# Patient Record
Sex: Male | Born: 1995 | Race: Black or African American | Hispanic: No | Marital: Single | Smoking: Never smoker
Health system: Southern US, Community
[De-identification: ages and names within clinical notes are randomized; demographics above are authoritative.]

## PROBLEM LIST (undated history)

## (undated) HISTORY — PX: NO PAST SURGERIES: SHX2092

---

## 2020-02-20 ENCOUNTER — Emergency Department (HOSPITAL_COMMUNITY): Payer: BLUE CROSS/BLUE SHIELD

## 2020-02-20 ENCOUNTER — Encounter (HOSPITAL_COMMUNITY): Payer: Self-pay

## 2020-02-20 ENCOUNTER — Other Ambulatory Visit: Payer: Self-pay

## 2020-02-20 ENCOUNTER — Emergency Department (HOSPITAL_COMMUNITY)
Admission: EM | Admit: 2020-02-20 | Discharge: 2020-02-20 | Disposition: A | Discharge status: Home / Self Care | Payer: BLUE CROSS/BLUE SHIELD | Attending: Emergency Medicine | Admitting: Emergency Medicine

## 2020-02-20 DIAGNOSIS — R0789 Other chest pain: Secondary | ICD-10-CM | POA: Insufficient documentation

## 2020-02-20 LAB — CBC WITH DIFFERENTIAL/PLATELET
Abs Immature Granulocytes: 0.02 10*3/uL (ref 0.00–0.07)
Basophils Absolute: 0 10*3/uL (ref 0.0–0.1)
Basophils Relative: 1 %
Eosinophils Absolute: 0.1 10*3/uL (ref 0.0–0.5)
Eosinophils Relative: 2 %
HCT: 48.9 % (ref 39.0–52.0)
Hemoglobin: 16.6 g/dL (ref 13.0–17.0)
Immature Granulocytes: 0 %
Lymphocytes Relative: 24 %
Lymphs Abs: 1.3 10*3/uL (ref 0.7–4.0)
MCH: 31.9 pg (ref 26.0–34.0)
MCHC: 33.9 g/dL (ref 30.0–36.0)
MCV: 93.9 fL (ref 80.0–100.0)
Monocytes Absolute: 0.5 10*3/uL (ref 0.1–1.0)
Monocytes Relative: 9 %
Neutro Abs: 3.5 10*3/uL (ref 1.7–7.7)
Neutrophils Relative %: 64 %
Platelets: 136 10*3/uL — ABNORMAL LOW (ref 150–400)
RBC: 5.21 MIL/uL (ref 4.22–5.81)
RDW: 12.9 % (ref 11.5–15.5)
WBC: 5.4 10*3/uL (ref 4.0–10.5)
nRBC: 0 % (ref 0.0–0.2)

## 2020-02-20 LAB — COMPREHENSIVE METABOLIC PANEL
ALT: 14 U/L (ref 0–44)
AST: 27 U/L (ref 15–41)
Albumin: 4 g/dL (ref 3.5–5.0)
Alkaline Phosphatase: 56 U/L (ref 38–126)
Anion gap: 8 (ref 5–15)
BUN: 9 mg/dL (ref 6–20)
CO2: 27 mmol/L (ref 22–32)
Calcium: 9.4 mg/dL (ref 8.9–10.3)
Chloride: 104 mmol/L (ref 98–111)
Creatinine, Ser: 1.26 mg/dL — ABNORMAL HIGH (ref 0.61–1.24)
GFR, Estimated: >60 mL/min (ref >60)
Glucose, Bld: 101 mg/dL — ABNORMAL HIGH (ref 70–99)
Potassium: 4.2 mmol/L (ref 3.5–5.1)
Sodium: 139 mmol/L (ref 135–145)
Total Bilirubin: 1.3 mg/dL — ABNORMAL HIGH (ref 0.3–1.2)
Total Protein: 6.7 g/dL (ref 6.5–8.1)

## 2020-02-20 LAB — URINALYSIS, ROUTINE W REFLEX MICROSCOPIC
Bacteria, UA: NONE SEEN
Bilirubin Urine: NEGATIVE
Glucose, UA: NEGATIVE mg/dL
Hgb urine dipstick: NEGATIVE
Ketones, ur: NEGATIVE mg/dL
Nitrite: NEGATIVE
Protein, ur: NEGATIVE mg/dL
Specific Gravity, Urine: 1.023 (ref 1.005–1.030)
pH: 7 (ref 5.0–8.0)

## 2020-02-20 LAB — PROTIME-INR
INR: 1 (ref 0.8–1.2)
Prothrombin Time: 13.2 seconds (ref 11.4–15.2)

## 2020-02-20 LAB — D-DIMER, QUANTITATIVE: D-Dimer, Quant: 0.34 ug/mL-FEU (ref 0.00–0.50)

## 2020-02-20 LAB — LIPASE, BLOOD: Lipase: 26 U/L (ref 11–51)

## 2020-02-20 LAB — TROPONIN I (HIGH SENSITIVITY): Troponin I (High Sensitivity): 4 ng/L (ref <18)

## 2020-02-20 NOTE — ED Triage Notes (Signed)
Pt reports Left side CP, SOB and headache after football practice yesterday. Seen at [facility] but sent home. Coach sent pt here to have further evaluation

## 2020-02-20 NOTE — Discharge Instructions (Addendum)
1.  At this time, there are no specific abnormalities on your x-rays, lab tests or EKGs. 2.  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 3.  You are released to return to practice at a modified level.  No heavy exertion, such as full speed running, repetitive exercises significantly increasing heart rate and respiratory rate.  If you experience any chest pain while exercising, stop immediately and go to a medical facility. 4.  You are to follow-up with cardiology for a recheck.  Schedule this as soon as possible.

## 2020-02-20 NOTE — ED Notes (Signed)
Pt stated that he did not want to checked for Covid because he was checked yesterday at school

## 2020-02-20 NOTE — ED Provider Notes (Signed)
[facility] EMERGENCY DEPARTMENT Provider Note   CSN: [id number] Arrival date & time: 02/20/20  0930     History Chief Complaint  Patient presents with  . Chest Pain    Adrian Brown is a 24 y.o. male.  HPI Patient experienced chest pain yesterday.  He is otherwise healthy without medical problems.  Patient reports he awakened yesterday morning, early.  He had a slight discomfort in his left chest but he went back to sleep for couple of hours before going to football practice.  He reports when he went to practice, he had some mild discomfort in the chest but did not report it at the time.  He reports a discomfort on the left upper chest.  They went on then to have a more intense practice and he was running aggressively.  He reports he then started to get a more intense chest pain in the left upper chest.  He denies any syncope.  He denies he specifically felt short of breath.  He does however reports that he slowed down and took a rest because the chest pain.  He denies he felt lightheaded or like he would pass out.  He denies he felt increasingly short of breath.  Patient reports he did let his coach know about it and they wanted him to get evaluated.  He went to urgent care yesterday but did not have extensive evaluation.  He was urged by his coach to come to the emergency department today for returning to practice.   Family history: No early onset coronary artery disease.  No known individuals with sudden unexplained death at young ages.  Patient had Covid immunization last dose in July.  He is tested every other day for football practice.  Patient has no personal history of having had Covid. History reviewed. No pertinent past medical history.  There are no problems to display for this patient.   History reviewed. No pertinent surgical history.     History reviewed. No pertinent family history.  Social History   Tobacco Use  . Smoking status: Never Smoker   . Smokeless tobacco: Never Used  Substance Use Topics  . Alcohol use: Never  . Drug use: Never    Home Medications Prior to Admission medications   Medication Sig Start Date End Date Taking? Authorizing Provider  ibuprofen (ADVIL) 200 MG tablet Take 400 mg by mouth every 6 (six) hours as needed for moderate pain.   Yes [provider]    Allergies    Patient has no known allergies.  Review of Systems   Review of Systems 10 systems reviewed and negative except as per HPI Physical Exam Updated Vital Signs BP (!) 126/58 (BP Location: Right Arm)   Pulse 91   Temp 97.6 F (36.4 C) (Oral)   Resp 17   Ht 6\' 5"  (1.956 m)   Wt 84.8 kg   SpO2 100%   BMI 22.17 kg/m   Physical Exam Constitutional:      Appearance: He is well-developed.  HENT:     Head: Normocephalic and atraumatic.     Mouth/Throat:     Pharynx: Oropharynx is clear.  Eyes:     Extraocular Movements: Extraocular movements intact.  Cardiovascular:     Rate and Rhythm: Normal rate and regular rhythm.     Heart sounds: Normal heart sounds.  Pulmonary:     Effort: Pulmonary effort is normal.     Breath sounds: Normal breath sounds.  Chest:  Chest wall: No tenderness.  Abdominal:     General: Bowel sounds are normal. There is no distension.     Palpations: Abdomen is soft.     Tenderness: There is no abdominal tenderness.  Musculoskeletal:        General: Normal range of motion.     Cervical back: Neck supple.  Skin:    General: Skin is warm and dry.  Neurological:     Mental Status: He is alert and oriented to person, place, and time.     GCS: GCS eye subscore is 4. GCS verbal subscore is 5. GCS motor subscore is 6.     Coordination: Coordination normal.     ED Results / Procedures / Treatments   Labs (all labs ordered are listed, but only abnormal results are displayed) Labs Reviewed  COMPREHENSIVE METABOLIC PANEL - Abnormal; Notable for the following components:      Result Value    Glucose, Bld 101 (*)    Creatinine, Ser 1.26 (*)    Total Bilirubin 1.3 (*)    All other components within normal limits  CBC WITH DIFFERENTIAL/PLATELET - Abnormal; Notable for the following components:   Platelets 136 (*)    All other components within normal limits  RESPIRATORY PANEL BY RT PCR (FLU A&B, COVID)  LIPASE, BLOOD  D-DIMER, QUANTITATIVE (NOT AT [facility])  PROTIME-INR  URINALYSIS, ROUTINE W REFLEX MICROSCOPIC  TROPONIN I (HIGH SENSITIVITY)  TROPONIN I (HIGH SENSITIVITY)    EKG EKG Interpretation  Date/Time:  Wednesday February 20 2020 09:37:14 EDT Ventricular Rate:  60 PR Interval:  140 QRS Duration: 88 QT Interval:  392 QTC Calculation: 392 R Axis:   96 Text Interpretation: Normal sinus rhythm Rightward axis Borderline ECG early repolarization, probable normal variant. Confirmed by Arby Barrette [id number]) on 02/20/2020 11:42:04 AM   Radiology DG Chest 2 View  Result Date: 02/20/2020 CLINICAL DATA:  Chest pain. EXAM: CHEST - 2 VIEW COMPARISON:  None. FINDINGS: The heart size and mediastinal contours are within normal limits. Both lungs are clear. No pneumothorax or pleural effusion is noted. The visualized skeletal structures are unremarkable. IMPRESSION: No active cardiopulmonary disease. Electronically Signed   By: Lupita Raider M.D.   On: 02/20/2020 10:12    Procedures Procedures (including critical care time)  Medications Ordered in ED Medications - No data to display  ED Course  I have reviewed the triage vital signs and the nursing notes.  Pertinent labs & imaging results that were available during my care of the patient were reviewed by me and considered in my medical decision making (see chart for details).    MDM Rules/Calculators/A&P                          Patient is clinically well in appearance.  Physical exam is normal.  At this time diagnostic evaluation is unremarkable.  Patient had episode of chest pain yesterday.  It was exacerbated  during practice.  He reports it is resolved at this time.  Patient does not appear to have risk factors for cardiac disease.  At this time, recommendation is to return to a modified lighter practice schedule.  Patient is made aware that should he experience any chest pain he is to discontinue exercise and seek evaluation.  Recommendation is for a follow-up appoint with cardiology before resuming full physical exertion. Final Clinical Impression(s) / ED Diagnoses Final diagnoses:  Atypical chest pain    Rx / DC Orders  ED Discharge Orders    None       Arby Barrette, MD 02/20/20 1324

## 2020-04-24 ENCOUNTER — Other Ambulatory Visit: Payer: Self-pay

## 2020-04-24 ENCOUNTER — Ambulatory Visit
Admission: EM | Admit: 2020-04-24 | Discharge: 2020-04-24 | Disposition: A | Discharge status: Home / Self Care | Payer: BLUE CROSS/BLUE SHIELD | Attending: Physician Assistant | Admitting: Physician Assistant

## 2020-04-24 ENCOUNTER — Encounter: Payer: Self-pay | Admitting: Emergency Medicine

## 2020-04-24 DIAGNOSIS — Z20822 Contact with and (suspected) exposure to covid-19: Secondary | ICD-10-CM | POA: Insufficient documentation

## 2020-04-24 DIAGNOSIS — J069 Acute upper respiratory infection, unspecified: Secondary | ICD-10-CM | POA: Insufficient documentation

## 2020-04-24 LAB — RESP PANEL BY RT-PCR (FLU A&B, COVID) ARPGX2
Influenza A by PCR: NEGATIVE
Influenza B by PCR: NEGATIVE
SARS Coronavirus 2 by RT PCR: POSITIVE — AB

## 2020-04-24 LAB — GROUP A STREP BY PCR: Group A Strep by PCR: NOT DETECTED

## 2020-04-24 NOTE — Discharge Instructions (Signed)
Sign up for mychart to check for your results.  If your Covid test ends up positive you may take the following supplements to help your immune system be stronger to fight this viral infection Take Quarcetin 500 mg three times a day x 7 days with Zinc 50 mg ones a day x 7 days. The quarcetin is an antiviral and anti-inflammatory supplement which helps open the zinc channels in the cell to absorb Zinc. Zinc helps decrease the virus load in your body. Take Melatonin 6-10 mg at bed time which also helps support your immune system.  Also make sure to take Vit D 5,000 IU per day with a fatty meal and Vit C 5000 mg a day until you are completely better. To prevent viral illnesses your vitamin D should be between 60-80. Stay on Vitamin D 2,000  and C  1000 mg the rest of the season.  Don't lay around, keep active and walk as much as you are able to to prevent worsening of your symptoms.  Follow up with your family Dr next week.  If you get short of breath and you are able to check  your oxygen with a pulse oxygen meter, if it gets to 92% or less, you need to go to the hospital to be admitted. If you dont have one, come back here and we will assess you.

## 2020-04-24 NOTE — ED Provider Notes (Signed)
[facility]    CSN: [id number] Arrival date & time: 04/24/20  1600      History   Chief Complaint Chief Complaint  Patient presents with  . Covid Exposure  . Sore Throat  . Nasal Congestion    HPI Adrian Brown is a 24 y.o. male who presents with onset of nose congestion and ST x 2 days. Denies fever, chills or sweats. Was exposed to covid 12/24 and 12/28. Appetite has been nl. Denies loss of taste or smell  Has had covid injections   History reviewed. No pertinent past medical history.  There are no problems to display for this patient.   Past Surgical History:  Procedure Laterality Date  . NO PAST SURGERIES       Home Medications    Prior to Admission medications   Not on File    Family History Family History  Problem Relation Age of Onset  . Healthy Mother   . Healthy Father     Social History Social History   Tobacco Use  . Smoking status: Never Smoker  . Smokeless tobacco: Never Used  Vaping Use  . Vaping Use: Never used  Substance Use Topics  . Alcohol use: Yes    Comment: occassional  . Drug use: Not Currently    Types: Marijuana    Comment: last use 1-2 years ago     Allergies   Patient has no known allergies.   Review of Systems Review of Systems  Constitutional: Negative for activity change, appetite change, chills, diaphoresis, fatigue and fever.  HENT: Positive for nosebleeds, postnasal drip, rhinorrhea and sore throat. Negative for congestion, ear discharge, ear pain and trouble swallowing.   Eyes: Negative for discharge.  Respiratory: Negative for cough, chest tightness and shortness of breath.   Cardiovascular: Negative for chest pain.  Gastrointestinal: Negative for diarrhea, nausea and vomiting.  Musculoskeletal: Negative for gait problem and myalgias.  Skin: Negative for rash.  Neurological: Negative for headaches.  Hematological: Negative for adenopathy.    Physical Exam Triage Vital Signs ED Triage  Vitals  Enc Vitals Group     BP 04/24/20 1809 108/85     Pulse Rate 04/24/20 1809 70     Resp 04/24/20 1809 18     Temp 04/24/20 1809 98.4 F (36.9 C)     Temp Source 04/24/20 1809 Oral     SpO2 04/24/20 1809 99 %     Weight 04/24/20 1808 185 lb (83.9 kg)     Height 04/24/20 1808 6\' 5"  (1.956 m)     Head Circumference --      Peak Flow --      Pain Score 04/24/20 1808 0     Pain Loc --      Pain Edu? --      Excl. in GC? --    No data found.  Updated Vital Signs BP 108/85 (BP Location: Left Arm)   Pulse 70   Temp 98.4 F (36.9 C) (Oral)   Resp 18   Ht 6\' 5"  (1.956 m)   Wt 185 lb (83.9 kg)   SpO2 99%   BMI 21.94 kg/m   Visual Acuity Right Eye Distance:   Left Eye Distance:   Bilateral Distance:    Right Eye Near:   Left Eye Near:    Bilateral Near:     Physical Exam Physical Exam Vitals signs and nursing note reviewed.  Constitutional:      General: he is not in acute  distress.    Appearance: Normal appearance. he is not ill-appearing, toxic-appearing or diaphoretic.  HENT:     Head: Normocephalic.     Right Ear: Tympanic membrane, ear canal and external ear normal.     Left Ear: Tympanic membrane, ear canal and external ear normal.     Nose: Nose normal.     Mouth/Throat:     Mouth: Mucous membranes are moist.  Eyes:     General: No scleral icterus.       Right eye: No discharge.        Left eye: No discharge.     Conjunctiva/sclera: Conjunctivae normal.  Neck:     Musculoskeletal: Neck supple. No neck rigidity.  Cardiovascular:     Rate and Rhythm: Normal rate and regular rhythm.     Heart sounds: No murmur.  Pulmonary:     Effort: Pulmonary effort is normal.     Breath sounds: Normal breath sounds.  Abdominal:     General: Bowel sounds are normal. There is no distension.     Palpations: Abdomen is soft. There is no mass.     Tenderness: There is no abdominal tenderness. There is no guarding or rebound.     Hernia: No hernia is present.   Musculoskeletal: Normal range of motion.  Lymphadenopathy:     Cervical: No cervical adenopathy.  Skin:    General: Skin is warm and dry.     Coloration: Skin is not jaundiced.     Findings: No rash.  Neurological:     Mental Status: he is alert and oriented to person, place, and time.     Gait: Gait normal.  Psychiatric:        Mood and Affect: Mood normal.        Behavior: Behavior normal.        Thought Content: Thought content normal.        Judgment: Judgment normal.     UC Treatments / Results  Labs (all labs ordered are listed, but only abnormal results are displayed) Labs Reviewed  RESP PANEL BY RT-PCR (FLU A&B, COVID) ARPGX2 - Abnormal; Notable for the following components:      Result Value   SARS Coronavirus 2 by RT PCR POSITIVE (*)    All other components within normal limits  GROUP A STREP BY PCR   Covid +. Flu and strep tests are neg  EKG   Radiology No results found.  Procedures Procedures (including critical care time)  Medications Ordered in UC Medications - No data to display  Initial Impression / Assessment and Plan / UC Course  I have reviewed the triage vital signs and the nursing notes. Has covid infection. See instructions Pertinent labs results that were available during my care of the patient were reviewed by me and considered in my medical decision making (see chart for details).   Final Clinical Impressions(s) / UC Diagnoses   Final diagnoses:  Close exposure to COVID-19 virus  Upper respiratory tract infection, unspecified type     Discharge Instructions     Sign up for mychart to check for your results.  If your Covid test ends up positive you may take the following supplements to help your immune system be stronger to fight this viral infection Take Quarcetin 500 mg three times a day x 7 days with Zinc 50 mg ones a day x 7 days. The quarcetin is an antiviral and anti-inflammatory supplement which helps open the zinc channels  in the cell  to absorb Zinc. Zinc helps decrease the virus load in your body. Take Melatonin 6-10 mg at bed time which also helps support your immune system.  Also make sure to take Vit D 5,000 IU per day with a fatty meal and Vit C 5000 mg a day until you are completely better. To prevent viral illnesses your vitamin D should be between 60-80. Stay on Vitamin D 2,000  and C  1000 mg the rest of the season.  Don't lay around, keep active and walk as much as you are able to to prevent worsening of your symptoms.  Follow up with your family Dr next week.  If you get short of breath and you are able to check  your oxygen with a pulse oxygen meter, if it gets to 92% or less, you need to go to the hospital to be admitted. If you dont have one, come back here and we will assess you.      ED Prescriptions    None     PDMP not reviewed this encounter.   Garey Ham, PA-C 04/24/20 2244

## 2020-04-24 NOTE — ED Triage Notes (Signed)
Patient in today c/o sore throat and nasal congestion x 2 days. Patient denies fever. Patient has taken OTC Ibuprofen and Mucinex. Patient was exposed to covid on 04/18/20 and 04/22/20. Patient has had the covid vaccines.

## 2021-08-07 IMAGING — DX DG CHEST 2V
2 series · 2 of 2 positions shown · non-contrast
Comparison: None.

CLINICAL DATA: Chest pain.

EXAM:
CHEST - 2 VIEW

[chest pa]
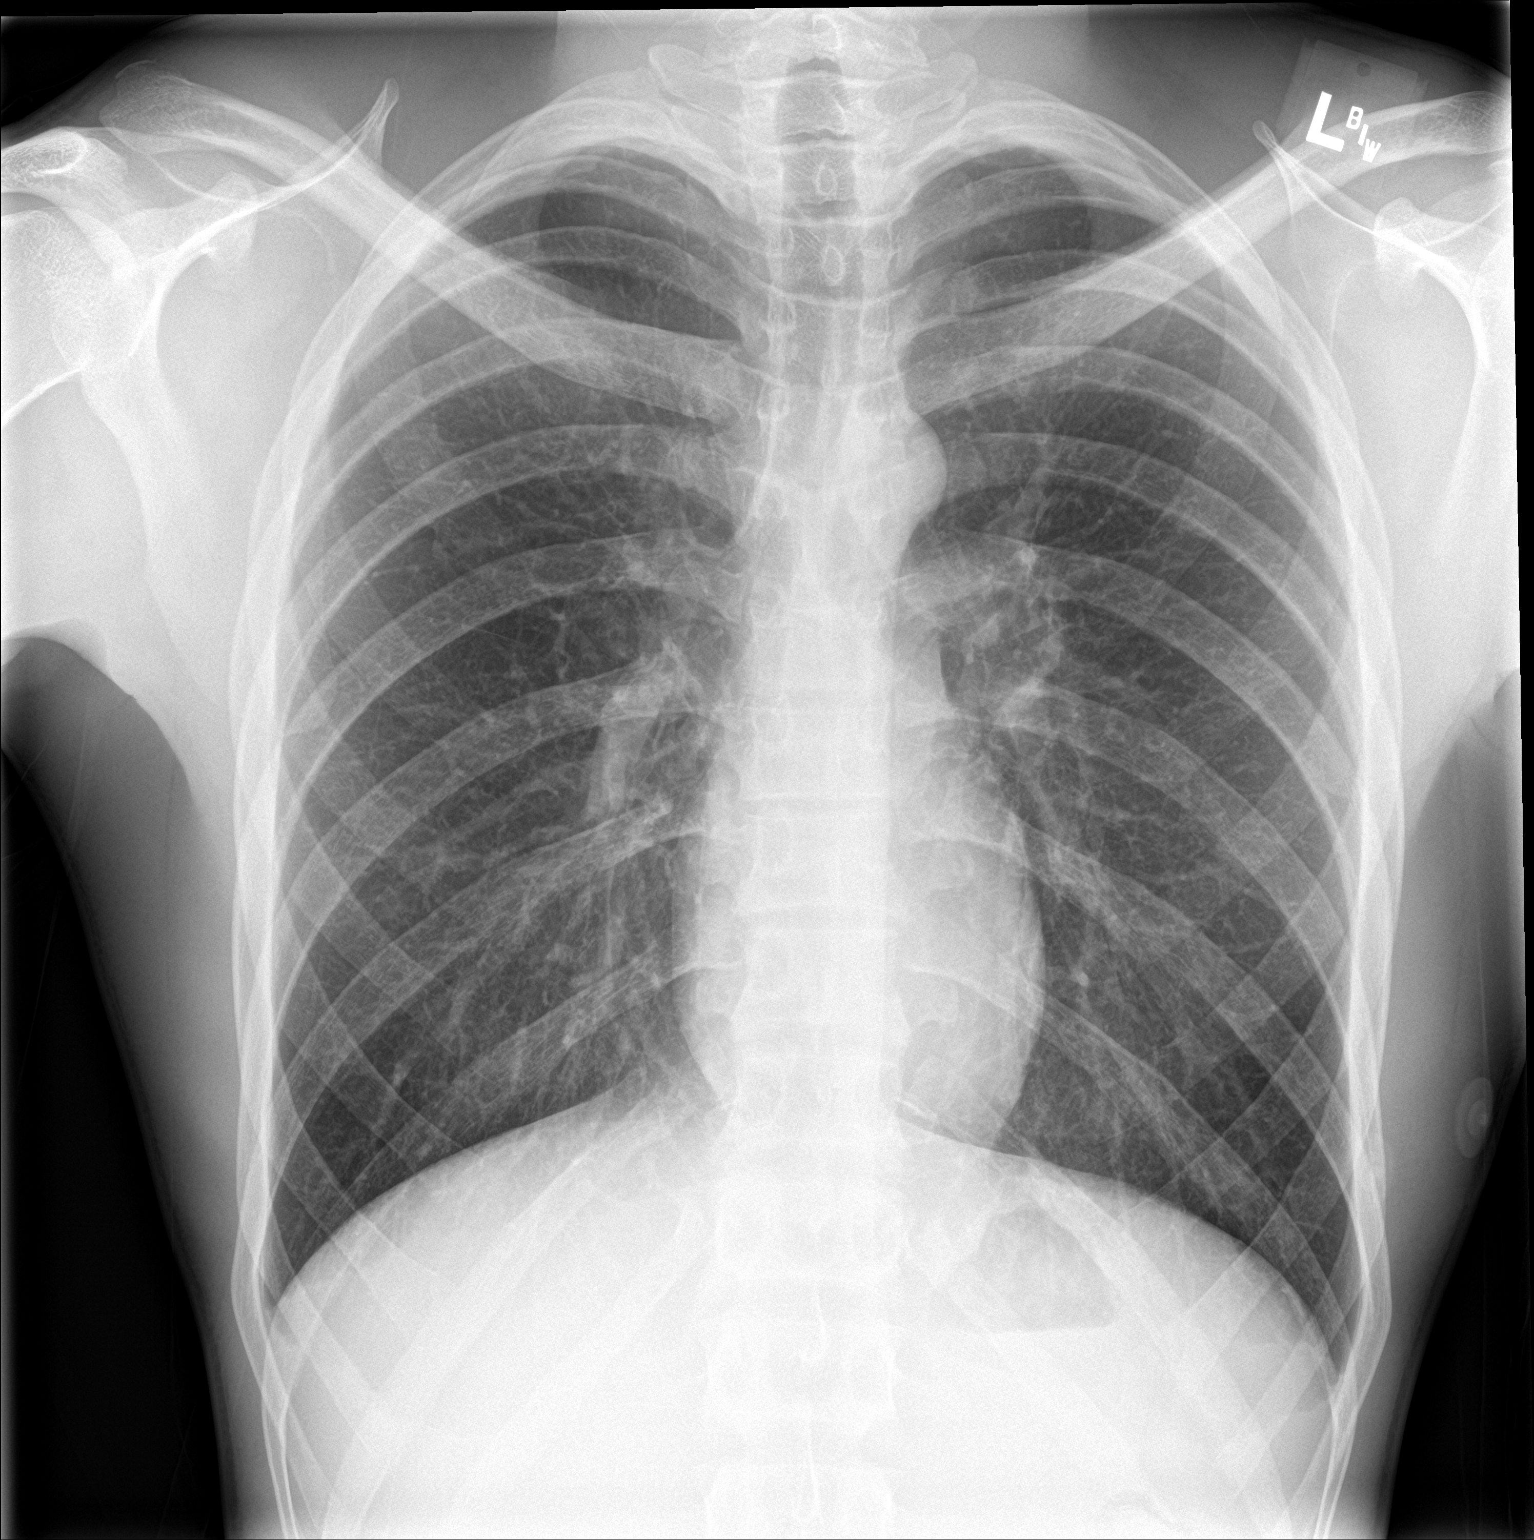

[chest lat]
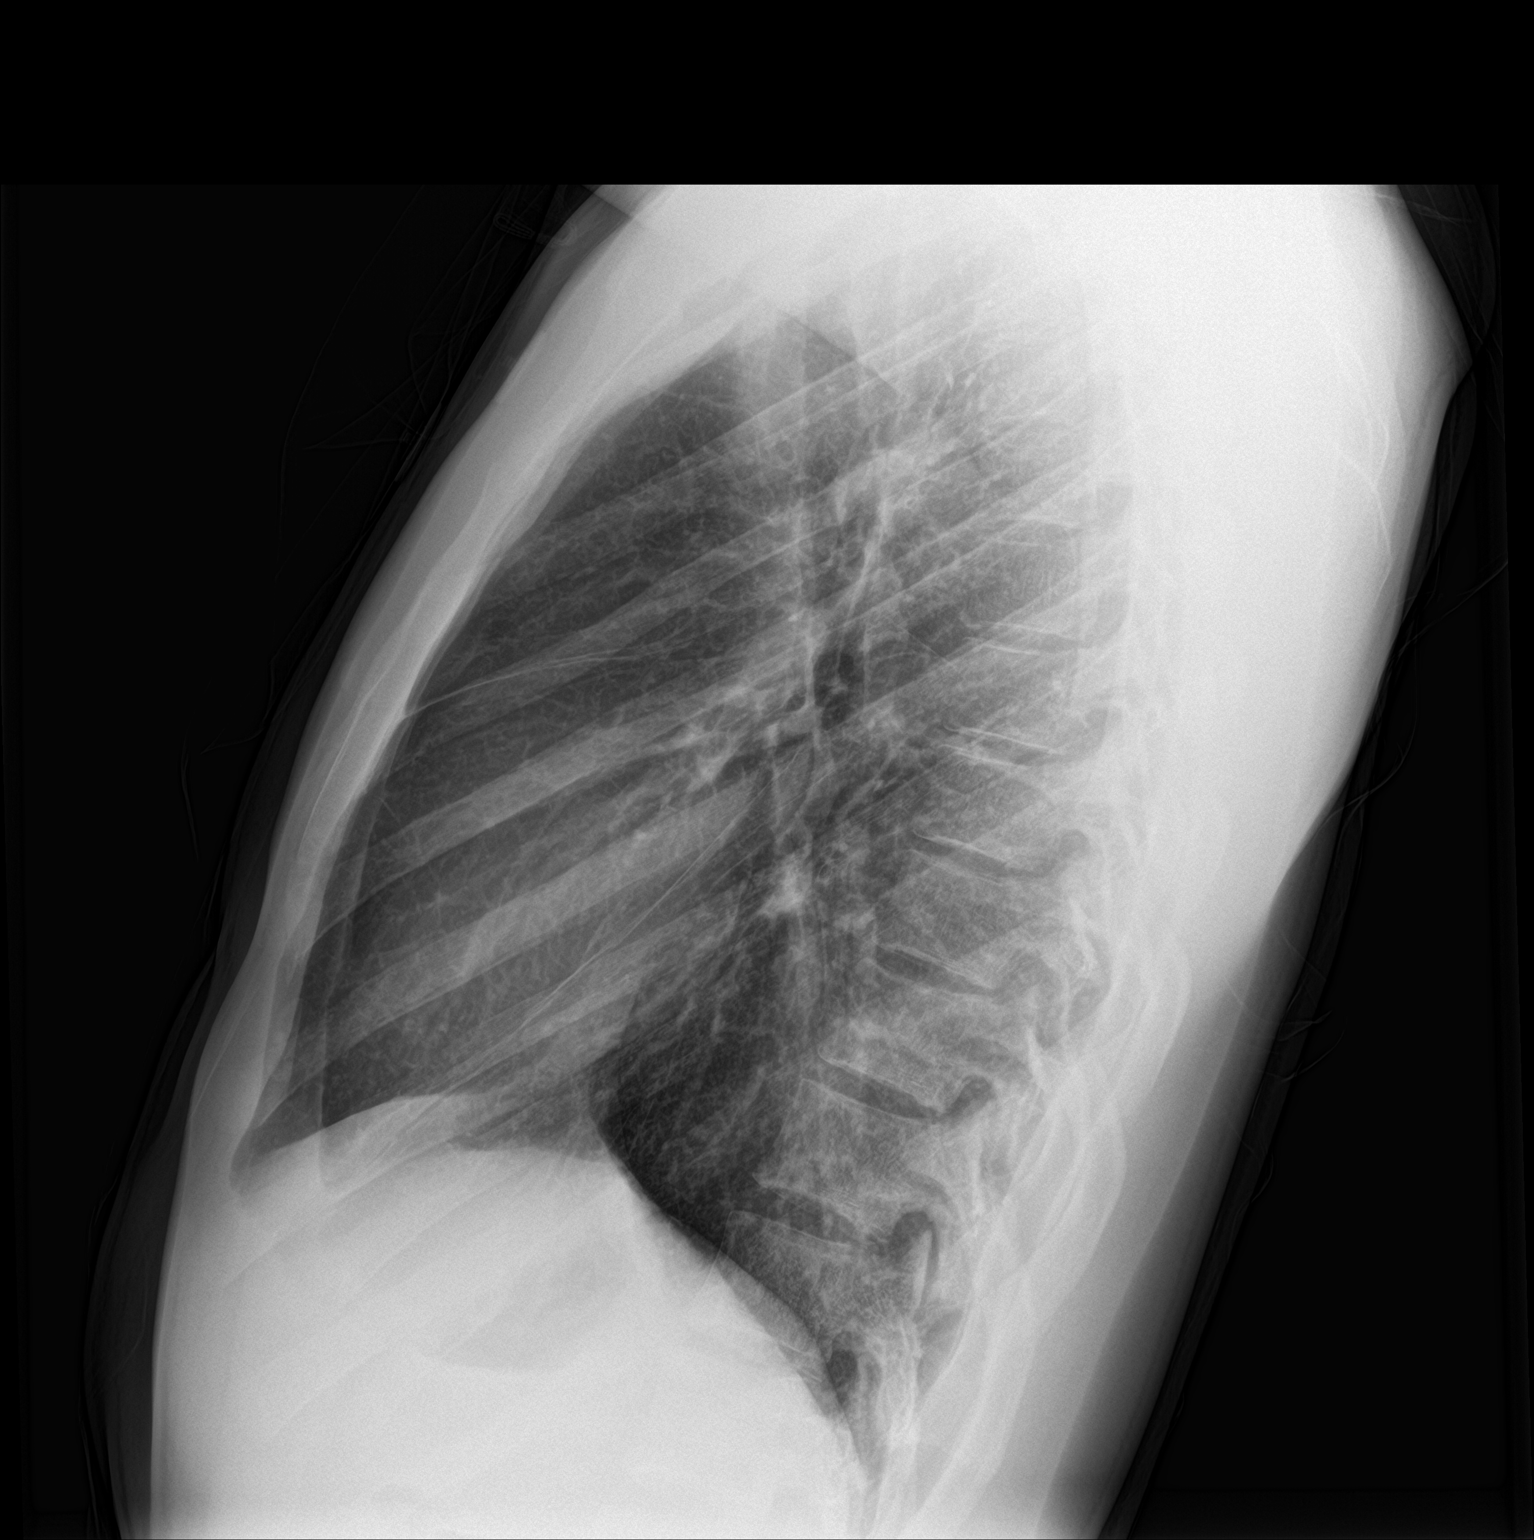

[2 of 2 positions shown; findings below may reference images not displayed]

FINDINGS: The heart size and mediastinal contours are within normal limits.
Both lungs are clear. No pneumothorax or pleural effusion is noted.
The visualized skeletal structures are unremarkable.
IMPRESSION: No active cardiopulmonary disease.

## 2021-09-14 ENCOUNTER — Ambulatory Visit
Admission: EM | Admit: 2021-09-14 | Discharge: 2021-09-14 | Disposition: A | Discharge status: Home / Self Care | Payer: Self-pay | Attending: Emergency Medicine | Admitting: Emergency Medicine

## 2021-09-14 DIAGNOSIS — R300 Dysuria: Secondary | ICD-10-CM | POA: Insufficient documentation

## 2021-09-14 DIAGNOSIS — Z202 Contact with and (suspected) exposure to infections with a predominantly sexual mode of transmission: Secondary | ICD-10-CM | POA: Insufficient documentation

## 2021-09-14 MED ORDER — DOXYCYCLINE HYCLATE 100 MG PO CAPS: 100.0000 mg | ORAL_CAPSULE | Freq: Two times a day (BID) | ORAL | 0 refills | Status: AC

## 2021-09-14 NOTE — ED Triage Notes (Signed)
Pt c/o Std Testing. PT is having penile burning even when not urinating x2days.   Pt partner told him that she was positive for Chlamydia.

## 2021-09-14 NOTE — Discharge Instructions (Addendum)
Today you are being treated prophylactically for chlamydia due to known exposure  Take doxycycline twice daily for the next 7 days  Labs pending 2-3 days, you will be contacted if positive for any sti and treatment will be sent to the pharmacy, you will have to return to the clinic if positive for gonorrhea to receive treatment   Please refrain from having sex until labs results, if positive please refrain from having sex until treatment complete and symptoms resolve   If positive for Chlamydia  gonorrhea or trichomoniasis please notify partner or partners so they may tested as well  Moving forward, it is recommended you use some form of protection against the transmission of sti infections  such as condoms or dental dams with each sexual encounter

## 2021-09-14 NOTE — ED Provider Notes (Signed)
[facility]    CSN: [id number] Arrival date & time: 09/14/21  1535      History   Chief Complaint Chief Complaint  Patient presents with   Exposure to STD    HPI Adrian Brown is a 26 y.o. male.   Patient presents with dysuria for 2 days.  Known exposure to chlamydia.  Sexually active, 1 male partner in the last 3 months, no condom use.  Denies urinary frequency, urgency, hematuria, lower abdominal pain or pressure, flank pain, fever, chills, penile or testicle swelling, new rash or lesions.  History reviewed. No pertinent past medical history.  There are no problems to display for this patient.   Past Surgical History:  Procedure Laterality Date   NO PAST SURGERIES         Home Medications    Prior to Admission medications   Medication Sig Start Date End Date Taking? Authorizing Provider  doxycycline (VIBRAMYCIN) 100 MG capsule Take 1 capsule (100 mg total) by mouth 2 (two) times daily. 09/14/21  Yes Legrande Hao, Elita Boone, NP    Family History Family History  Problem Relation Age of Onset   Healthy Mother    Healthy Father     Social History Social History   Tobacco Use   Smoking status: Never   Smokeless tobacco: Never  Vaping Use   Vaping Use: Never used  Substance Use Topics   Alcohol use: Yes    Comment: occassional   Drug use: Not Currently    Types: Marijuana    Comment: last use 1-2 years ago     Allergies   Patient has no known allergies.   Review of Systems Review of Systems  Constitutional: Negative.   Respiratory: Negative.    Cardiovascular: Negative.   Genitourinary:  Positive for dysuria. Negative for decreased urine volume, difficulty urinating, enuresis, flank pain, frequency, genital sores, hematuria, penile discharge, penile pain, penile swelling, scrotal swelling, testicular pain and urgency.  Skin: Negative.     Physical Exam Triage Vital Signs ED Triage Vitals  Enc Vitals Group     BP 09/14/21 1625  103/82     Pulse Rate 09/14/21 1625 84     Resp 09/14/21 1625 18     Temp 09/14/21 1625 98.7 F (37.1 C)     Temp Source 09/14/21 1625 Oral     SpO2 09/14/21 1625 98 %     Weight --      Height --      Head Circumference --      Peak Flow --      Pain Score 09/14/21 1623 9     Pain Loc --      Pain Edu? --      Excl. in GC? --    No data found.  Updated Vital Signs BP 103/82 (BP Location: Left Arm)   Pulse 84   Temp 98.7 F (37.1 C) (Oral)   Resp 18   SpO2 98%   Visual Acuity Right Eye Distance:   Left Eye Distance:   Bilateral Distance:    Right Eye Near:   Left Eye Near:    Bilateral Near:     Physical Exam Constitutional:      Appearance: Normal appearance.  HENT:     Head: Normocephalic.  Eyes:     Extraocular Movements: Extraocular movements intact.  Pulmonary:     Effort: Pulmonary effort is normal.  Genitourinary:    Comments: deferred Neurological:     Mental Status:  He is alert and oriented to person, place, and time. Mental status is at baseline.  Psychiatric:        Mood and Affect: Mood normal.        Behavior: Behavior normal.     UC Treatments / Results  Labs (all labs ordered are listed, but only abnormal results are displayed) Labs Reviewed  CYTOLOGY, (ORAL, ANAL, URETHRAL) ANCILLARY ONLY    EKG   Radiology No results found.  Procedures Procedures (including critical care time)  Medications Ordered in UC Medications - No data to display  Initial Impression / Assessment and Plan / UC Course  I have reviewed the triage vital signs and the nursing notes.  Pertinent labs & imaging results that were available during my care of the patient were reviewed by me and considered in my medical decision making (see chart for details).  Dysuria STD exposure  We will plan prophylactically treat for chlamydia, doxycycline 7-day course prescribed, STI labs pending, declined HIV and syphilis testing, will treat per protocol, advised  abstinence until all medication is complete and symptoms have resolved, may follow-up with urgent care as needed for persisting or reoccurring Final Clinical Impressions(s) / UC Diagnoses   Final diagnoses:  STD exposure  Dysuria     Discharge Instructions      Today you are being treated prophylactically for chlamydia due to known exposure  Take doxycycline twice daily for the next 7 days  Labs pending 2-3 days, you will be contacted if positive for any sti and treatment will be sent to the pharmacy, you will have to return to the clinic if positive for gonorrhea to receive treatment   Please refrain from having sex until labs results, if positive please refrain from having sex until treatment complete and symptoms resolve   If positive for HIV, Syphilis, Chlamydia  gonorrhea or trichomoniasis please notify partner or partners so they may tested as well  Moving forward, it is recommended you use some form of protection against the transmission of sti infections  such as condoms or dental dams with each sexual encounter     ED Prescriptions     Medication Sig Dispense Auth. Provider   doxycycline (VIBRAMYCIN) 100 MG capsule Take 1 capsule (100 mg total) by mouth 2 (two) times daily. 14 capsule Antwanette Wesche, Elita Boone, NP      PDMP not reviewed this encounter.   Valinda Hoar, NP 09/14/21 1635

## 2021-09-15 LAB — CYTOLOGY, (ORAL, ANAL, URETHRAL) ANCILLARY ONLY
Chlamydia: NEGATIVE
Comment: NEGATIVE
Comment: NEGATIVE
Comment: NORMAL
Neisseria Gonorrhea: NEGATIVE
Trichomonas: NEGATIVE
